# Patient Record
Sex: Male | Born: 1990 | Race: Black or African American | Hispanic: No | Marital: Single | Smoking: Former smoker
Health system: Southern US, Community
[De-identification: ages and names within clinical notes are randomized; demographics above are authoritative.]

---

## 2012-05-29 ENCOUNTER — Emergency Department (INDEPENDENT_AMBULATORY_CARE_PROVIDER_SITE_OTHER)
Admission: EM | Admit: 2012-05-29 | Discharge: 2012-05-29 | Disposition: A | Discharge status: Home / Self Care | Payer: PRIVATE HEALTH INSURANCE | Source: Home / Self Care | Attending: Emergency Medicine | Admitting: Emergency Medicine

## 2012-05-29 ENCOUNTER — Encounter (HOSPITAL_COMMUNITY): Payer: Self-pay

## 2012-05-29 DIAGNOSIS — R05 Cough: Secondary | ICD-10-CM

## 2012-05-29 DIAGNOSIS — J069 Acute upper respiratory infection, unspecified: Secondary | ICD-10-CM

## 2012-05-29 MED ORDER — CETIRIZINE-PSEUDOEPHEDRINE ER 5-120 MG PO TB12: 1.0000 | ORAL_TABLET | Freq: Two times a day (BID) | ORAL | Status: AC

## 2012-05-29 MED ORDER — ALBUTEROL SULFATE HFA 108 (90 BASE) MCG/ACT IN AERS: 2.0000 | INHALATION_SPRAY | RESPIRATORY_TRACT | Status: DC | PRN

## 2012-05-29 MED ORDER — SALINE NASAL SPRAY 0.65 % NA SOLN: 1.0000 | NASAL | Status: DC | PRN

## 2012-05-29 NOTE — ED Notes (Signed)
C/o productive cough of green and brown sputum and intermittent sore throat for 1 month.

## 2012-05-29 NOTE — ED Provider Notes (Signed)
History     CSN: [id number]  Arrival date & time 05/29/12  1240   First MD Initiated Contact with Patient 05/29/12 1302      Chief Complaint  Patient presents with  . Sore Throat  . Cough    (Consider location/radiation/quality/duration/timing/severity/associated sxs/prior treatment) The history is provided by the patient.  Andrew Cline is a 21 y.o. male who complains of onset of cold symptoms for one month.  + sore throat + cough, expectorating greenish brown sputum No pleuritic pain No wheezing + nasal congestion + post-nasal drainage No sinus pain/pressure No voice changes + chest congestion No itchy/red eyes No earache No hemoptysis No SOB + chills/sweats No fever No nausea No vomiting No abdominal pain No diarrhea No skin rashes No fatigue No myalgias No headache  No ill contacts  History reviewed. No pertinent past medical history.  History reviewed. No pertinent past surgical history.  No family history on file.  History  Substance Use Topics  . Smoking status: Never Smoker   . Smokeless tobacco: Not on file  . Alcohol Use: No      Review of Systems  All other systems reviewed and are negative.    Allergies  Review of patient's allergies indicates no known allergies.  Home Medications   Current Outpatient Rx  Name Route Sig Dispense Refill  . ALBUTEROL SULFATE HFA 108 (90 BASE) MCG/ACT IN AERS Inhalation Inhale 2 puffs into the lungs every 4 (four) hours as needed for wheezing. 1 Inhaler 1  . CETIRIZINE-PSEUDOEPHEDRINE ER 5-120 MG PO TB12 Oral Take 1 tablet by mouth 2 (two) times daily. 30 tablet 2  . SALINE NASAL SPRAY 0.65 % NA SOLN Nasal Place 1 spray into the nose as needed for congestion. 30 mL 12    BP 122/83  Pulse 74  Temp 98.5 F (36.9 C) (Oral)  Resp 18  SpO2 96%  Physical Exam  Nursing note and vitals reviewed. Constitutional: He is oriented to person, place, and time. Vital signs are normal. He appears well-developed and  well-nourished. He is active and cooperative.  HENT:  Head: Normocephalic.  Right Ear: Hearing, tympanic membrane, external ear and ear canal normal.  Left Ear: Hearing, tympanic membrane, external ear and ear canal normal.  Nose: Nose normal. Right sinus exhibits no maxillary sinus tenderness and no frontal sinus tenderness. Left sinus exhibits no maxillary sinus tenderness and no frontal sinus tenderness.  Mouth/Throat: Uvula is midline and mucous membranes are normal. Posterior oropharyngeal erythema present. No oropharyngeal exudate, posterior oropharyngeal edema or tonsillar abscesses.       Post nasal drip noted in posterior pharynx  Eyes: Conjunctivae are normal. Pupils are equal, round, and reactive to light. No scleral icterus.  Neck: Trachea normal and normal range of motion. Neck supple. No muscular tenderness present.  Cardiovascular: Normal rate, regular rhythm and normal heart sounds.   Pulmonary/Chest: Effort normal and breath sounds normal.  Neurological: He is alert and oriented to person, place, and time. No cranial nerve deficit or sensory deficit.  Skin: Skin is warm and dry.  Psychiatric: He has a normal mood and affect. His speech is normal and behavior is normal. Judgment and thought content normal. Cognition and memory are normal.    ED Course  Procedures (including critical care time)  Labs Reviewed - No data to display No results found.   1. URI (upper respiratory infection)   2. Cough       MDM  Increase fluid intake, rest.  No antibiotic  indicated.  Begin expectorant/decongestant, topical decongestant, saline nasal spray and/or saline irrigation, and cough suppressant at bedtime. Antihistamines of your choice (Claritin or Zyrtec).  Tylenol or Motrin for fever/discomfort.  Followup with PCP if not improving 7 to 10 days.         Johnsie Kindred, NP 05/29/12 1410

## 2012-05-30 NOTE — ED Provider Notes (Signed)
Medical screening examination/treatment/procedure(s) were performed by non-physician practitioner and as supervising physician I was immediately available for consultation/collaboration.  Raynald Blend, MD 05/30/12 [phone]

## 2013-11-03 ENCOUNTER — Emergency Department (HOSPITAL_COMMUNITY)
Admission: EM | Admit: 2013-11-03 | Discharge: 2013-11-03 | Disposition: A | Discharge status: Home / Self Care | Payer: BC Managed Care – PPO | Source: Home / Self Care | Attending: Emergency Medicine | Admitting: Emergency Medicine

## 2013-11-03 ENCOUNTER — Encounter (HOSPITAL_COMMUNITY): Payer: Self-pay | Admitting: Emergency Medicine

## 2013-11-03 DIAGNOSIS — B029 Zoster without complications: Secondary | ICD-10-CM

## 2013-11-03 MED ORDER — VALACYCLOVIR HCL 1 G PO TABS: 1000.0000 mg | ORAL_TABLET | Freq: Three times a day (TID) | ORAL | Status: AC

## 2013-11-03 MED ORDER — PREDNISONE 10 MG PO KIT: PACK | ORAL | Status: DC

## 2013-11-03 NOTE — ED Provider Notes (Signed)
CSN: [id number]     Arrival date & time 11/03/13  1641 History   First MD Initiated Contact with Patient 11/03/13 1704     Chief Complaint  Patient presents with  . Rash   (Consider location/radiation/quality/duration/timing/severity/associated sxs/prior Treatment) HPI Comments: 23 year old male presents complaining of rash. For 3 days, he has had an itching rash in the center of his back. He has tried putting antibiotic cream on it which has not helped. He is never had a rash like this before. He denies any symptoms elsewhere and has no systemic symptoms. he does have a history of chickenpox when he was little.  Patient is a 23 y.o. male presenting with rash.  Rash Associated symptoms: no abdominal pain, no diarrhea, no fatigue, no fever, no joint pain, no myalgias, no nausea, no shortness of breath, no sore throat and not vomiting     History reviewed. No pertinent past medical history. History reviewed. No pertinent past surgical history. History reviewed. No pertinent family history. History  Substance Use Topics  . Smoking status: Never Smoker   . Smokeless tobacco: Not on file  . Alcohol Use: No    Review of Systems  Constitutional: Negative for fever, chills and fatigue.  HENT: Negative for sore throat.   Eyes: Negative for visual disturbance.  Respiratory: Negative for cough and shortness of breath.   Cardiovascular: Negative for chest pain, palpitations and leg swelling.  Gastrointestinal: Negative for nausea, vomiting, abdominal pain, diarrhea and constipation.  Genitourinary: Negative for dysuria, urgency, frequency and hematuria.  Musculoskeletal: Negative for arthralgias, myalgias, neck pain and neck stiffness.  Skin: Positive for rash.  Neurological: Negative for dizziness, weakness and light-headedness.    Allergies  Review of patient's allergies indicates no known allergies.  Home Medications   Current Outpatient Rx  Name  Route  Sig  Dispense  Refill  .  EXPIRED: albuterol (PROVENTIL HFA;VENTOLIN HFA) 108 (90 BASE) MCG/ACT inhaler   Inhalation   Inhale 2 puffs into the lungs every 4 (four) hours as needed for wheezing.   1 Inhaler   1   . PredniSONE 10 MG KIT      12 day taper dose pack. Use as directed   48 each   0   . EXPIRED: sodium chloride (OCEAN NASAL SPRAY) 0.65 % nasal spray   Nasal   Place 1 spray into the nose as needed for congestion.   30 mL   12   . valACYclovir (VALTREX) 1000 MG tablet   Oral   Take 1 tablet (1,000 mg total) by mouth 3 (three) times daily.   21 tablet   0    BP 115/74  Pulse 75  Temp(Src) 98.6 F (37 C) (Oral)  Resp 14  SpO2 100% Physical Exam  Nursing note and vitals reviewed. Constitutional: He is oriented to person, place, and time. He appears well-developed and well-nourished. No distress.  HENT:  Head: Normocephalic.  Pulmonary/Chest: Effort normal. No respiratory distress.  Neurological: He is alert and oriented to person, place, and time. Coordination normal.  Skin: Skin is warm and dry. Rash noted. Rash is vesicular (erythematous, grouped nontender vesicular in the lower thoracic back from the midline extending to the right, approximately an 8 cm diameter confluence). He is not diaphoretic.  Psychiatric: He has a normal mood and affect. Judgment normal.    ED Course  Procedures (including critical care time) Labs Review Labs Reviewed - No data to display Imaging Review No results found.    MDM  1. Herpes zoster    Treated with Valtrex and symptomatic management. Recommended over-the-counter antihistamines as needed for itching. Followup when necessary if worsening or if this does not follow the expected course of shingles, reviewed in detail with the patient.  Meds ordered this encounter  Medications  . valACYclovir (VALTREX) 1000 MG tablet    Sig: Take 1 tablet (1,000 mg total) by mouth 3 (three) times daily.    Dispense:  21 tablet    Refill:  0  . PredniSONE 10  MG KIT    Sig: 12 day taper dose pack. Use as directed    Dispense:  48 each    Refill:  Pomeroy Airrion Otting, PA-C 11/03/13 1751

## 2013-11-03 NOTE — Discharge Instructions (Signed)
Shingles Shingles (herpes zoster) is an infection that is caused by the same virus that causes chickenpox (varicella). The infection causes a painful skin rash and fluid-filled blisters, which eventually break open, crust over, and heal. It may occur in any area of the body, but it usually affects only one side of the body or face. The pain of shingles usually lasts about 1 month. However, some people with shingles may develop long-term (chronic) pain in the affected area of the body. Shingles often occurs many years after the person had chickenpox. It is more common:  In people older than 50 years.  In people with weakened immune systems, such as those with HIV, AIDS, or cancer.  In people taking medicines that weaken the immune system, such as transplant medicines.  In people under great stress. CAUSES  Shingles is caused by the varicella zoster virus (VZV), which also causes chickenpox. After a person is infected with the virus, it can remain in the person's body for years in an inactive state (dormant). To cause shingles, the virus reactivates and breaks out as an infection in a nerve root. The virus can be spread from person to person (contagious) through contact with open blisters of the shingles rash. It will only spread to people who have not had chickenpox. When these people are exposed to the virus, they may develop chickenpox. They will not develop shingles. Once the blisters scab over, the person is no longer contagious and cannot spread the virus to others. SYMPTOMS  Shingles shows up in stages. The initial symptoms may be pain, itching, and tingling in an area of the skin. This pain is usually described as burning, stabbing, or throbbing.In a few days or weeks, a painful red rash will appear in the area where the pain, itching, and tingling were felt. The rash is usually on one side of the body in a band or belt-like pattern. Then, the rash usually turns into fluid-filled blisters. They  will scab over and dry up in approximately 2 3 weeks. Flu-like symptoms may also occur with the initial symptoms, the rash, or the blisters. These may include:  Fever.  Chills.  Headache.  Upset stomach. DIAGNOSIS  Your caregiver will perform a skin exam to diagnose shingles. Skin scrapings or fluid samples may also be taken from the blisters. This sample will be examined under a microscope or sent to a lab for further testing. TREATMENT  There is no specific cure for shingles. Your caregiver will likely prescribe medicines to help you manage the pain, recover faster, and avoid long-term problems. This may include antiviral drugs, anti-inflammatory drugs, and pain medicines. HOME CARE INSTRUCTIONS   Take a cool bath or apply cool compresses to the area of the rash or blisters as directed. This may help with the pain and itching.   Only take over-the-counter or prescription medicines as directed by your caregiver.   Rest as directed by your caregiver.  Keep your rash and blisters clean with mild soap and cool water or as directed by your caregiver.  Do not pick your blisters or scratch your rash. Apply an anti-itch cream or numbing creams to the affected area as directed by your caregiver.  Keep your shingles rash covered with a loose bandage (dressing).  Avoid skin contact with:  Babies.   Pregnant women.   Children with eczema.   Elderly people with transplants.   People with chronic illnesses, such as leukemia or AIDS.   Wear loose-fitting clothing to help ease   the pain of material rubbing against the rash.  Keep all follow-up appointments with your caregiver.If the area involved is on your face, you may receive a referral for follow-up to a specialist, such as an eye doctor (ophthalmologist) or an ear, nose, and throat (ENT) doctor. Keeping all follow-up appointments will help you avoid eye complications, chronic pain, or disability.  SEEK IMMEDIATE MEDICAL  CARE IF:   You have facial pain, pain around the eye area, or loss of feeling on one side of your face.  You have ear pain or ringing in your ear.  You have loss of taste.  Your pain is not relieved with prescribed medicines.   Your redness or swelling spreads.   You have more pain and swelling.  Your condition is worsening or has changed.   You have a feveror persistent symptoms for more than 2 3 days.  You have a fever and your symptoms suddenly get worse. MAKE SURE YOU:  Understand these instructions.  Will watch your condition.  Will get help right away if you are not doing well or get worse. Document Released: 09/17/2005 Document Revised: 06/11/2012 Document Reviewed: 05/01/2012 [employer] Patient Information 2014 [employer].  

## 2013-11-03 NOTE — ED Provider Notes (Signed)
Medical screening examination/treatment/procedure(s) were performed by non-physician practitioner and as supervising physician I was immediately available for consultation/collaboration.  Omolola Mittman, M.D.  Janaiyah Blackard C Angelette Ganus, MD 11/03/13 2201 

## 2013-11-03 NOTE — ED Notes (Signed)
C/o blister like rash in center of back x 3 days with irritation.  Pt has tried antibiotic cream with no relief.  Denies any changes in soaps or detergents.

## 2013-11-04 NOTE — ED Notes (Signed)
Chart review.

## 2013-11-04 NOTE — ED Notes (Signed)
Patient called w questions, left message on answering machine. after discussion w Dr Lorenz CoasterKeller, pt has been advised to try to stop touching his eyes, use cool compresses, use systaine OTC eye drops, and to be rechecked if has continued problem

## 2014-01-08 ENCOUNTER — Encounter (HOSPITAL_COMMUNITY): Payer: Self-pay | Admitting: Emergency Medicine

## 2014-01-08 ENCOUNTER — Emergency Department (HOSPITAL_COMMUNITY): Payer: BC Managed Care – PPO

## 2014-01-08 ENCOUNTER — Emergency Department (HOSPITAL_COMMUNITY)
Admission: EM | Admit: 2014-01-08 | Discharge: 2014-01-08 | Disposition: A | Discharge status: Home / Self Care | Payer: BC Managed Care – PPO | Attending: Emergency Medicine | Admitting: Emergency Medicine

## 2014-01-08 DIAGNOSIS — IMO0002 Reserved for concepts with insufficient information to code with codable children: Secondary | ICD-10-CM | POA: Insufficient documentation

## 2014-01-08 DIAGNOSIS — R0602 Shortness of breath: Secondary | ICD-10-CM | POA: Insufficient documentation

## 2014-01-08 DIAGNOSIS — R042 Hemoptysis: Secondary | ICD-10-CM | POA: Insufficient documentation

## 2014-01-08 DIAGNOSIS — R059 Cough, unspecified: Secondary | ICD-10-CM | POA: Insufficient documentation

## 2014-01-08 DIAGNOSIS — R05 Cough: Secondary | ICD-10-CM | POA: Insufficient documentation

## 2014-01-08 DIAGNOSIS — R079 Chest pain, unspecified: Secondary | ICD-10-CM | POA: Insufficient documentation

## 2014-01-08 LAB — I-STAT CHEM 8, ED
BUN: 11 mg/dL (ref 6–23)
CALCIUM ION: 1.27 mmol/L — AB (ref 1.12–1.23)
CREATININE: 1.1 mg/dL (ref 0.50–1.35)
Chloride: 102 mEq/L (ref 96–112)
GLUCOSE: 121 mg/dL — AB (ref 70–99)
HEMATOCRIT: 47 % (ref 39.0–52.0)
HEMOGLOBIN: 16 g/dL (ref 13.0–17.0)
POTASSIUM: 4.3 meq/L (ref 3.7–5.3)
Sodium: 143 mEq/L (ref 137–147)
TCO2: 29 mmol/L (ref 0–100)

## 2014-01-08 LAB — D-DIMER, QUANTITATIVE: D-Dimer, Quant: <0.27 ug/mL-FEU (ref 0.00–0.48)

## 2014-01-08 NOTE — ED Notes (Signed)
Pt states that he stopped smoking 2 weeks ago and had intermit chest pain to rt and lt chest and rlq abd. States that he felt as if he had some burning feeling. Intermit cough, no sob, no hx.

## 2014-01-08 NOTE — Discharge Instructions (Signed)

## 2014-01-08 NOTE — ED Provider Notes (Signed)
CSN: [id number]     Arrival date & time 01/08/14  1642 History   First MD Initiated Contact with Patient 01/08/14 2000     Chief Complaint  Patient presents with  . Pleurisy     Patient is a 23 y.o. male presenting with chest pain. The history is provided by the patient.  Chest Pain Pain location:  R chest (posterior chest) Pain quality: sharp   Pain severity:  Moderate Onset quality:  Gradual Duration:  2 weeks Timing:  Intermittent Progression:  Worsening Chronicity:  New Relieved by:  Rest Worsened by:  Deep breathing Associated symptoms: cough and shortness of breath   Associated symptoms: no abdominal pain, no fever and not vomiting    Pt reports CP on/off for 2 weeks Mostly worse with deep breathing He reports one episode of hemoptysis He reports SOB  He denies known h/o cardiac disease No h/o DVT/PE     PMH - none  History  Substance Use Topics  . Smoking status: Never Smoker   . Smokeless tobacco: Not on file  . Alcohol Use: No    Review of Systems  Constitutional: Negative for fever.  Respiratory: Positive for cough and shortness of breath.   Cardiovascular: Positive for chest pain.  Gastrointestinal: Negative for vomiting and abdominal pain.  All other systems reviewed and are negative.     Allergies  Review of patient's allergies indicates no known allergies.  Home Medications   Current Outpatient Rx  Name  Route  Sig  Dispense  Refill  . EXPIRED: albuterol (PROVENTIL HFA;VENTOLIN HFA) 108 (90 BASE) MCG/ACT inhaler   Inhalation   Inhale 2 puffs into the lungs every 4 (four) hours as needed for wheezing.   1 Inhaler   1   . PredniSONE 10 MG KIT      12 day taper dose pack. Use as directed   48 each   0   . EXPIRED: sodium chloride (OCEAN NASAL SPRAY) 0.65 % nasal spray   Nasal   Place 1 spray into the nose as needed for congestion.   30 mL   12    BP 131/80  Pulse 80  Temp(Src) 99.8 F (37.7 C) (Oral)  Resp 20  SpO2  98% Physical Exam CONSTITUTIONAL: Well developed/well nourished HEAD: Normocephalic/atraumatic EYES: EOMI/PERRL ENMT: Mucous membranes moist NECK: supple no meningeal signs SPINE:entire spine nontender CV: S1/S2 noted, no murmurs/rubs/gallops noted LUNGS: Lungs are clear to auscultation bilaterally, no apparent distress Chest - nontender to palpation ABDOMEN: soft, nontender, no rebound or guarding GU:no cva tenderness NEURO: Pt is awake/alert, moves all extremitiesx4 EXTREMITIES: pulses normal, full ROM, no LE pain/edema SKIN: warm, color normal PSYCH: no abnormalities of mood noted  ED Course  Procedures  9:17 PM D-dimer ordered to r/o PE as unable to utilize [facility] score as he had hemoptysis He is well appearing at this time 10:08 PM Pt well appearing, no distress, watching TV D-dimer negative I doubt ACS/dissection at this time Stable for d/c home Labs Review Labs Reviewed  I-STAT CHEM 8, ED - Abnormal; Notable for the following:    Glucose, Bld 121 (*)    Calcium, Ion 1.27 (*)    All other components within normal limits  D-DIMER, QUANTITATIVE   Imaging Review Dg Chest 2 View  01/08/2014   CLINICAL DATA:  Pleurisy. Shortness of breath with chest pain, cough and congestion for 2 weeks.  EXAM: CHEST  2 VIEW  COMPARISON:  None.  FINDINGS: The heart size and mediastinal  contours are normal. The lungs are clear. There is no pleural effusion or pneumothorax. No acute osseous findings are identified.  IMPRESSION: No active cardiopulmonary process.   Electronically Signed   By: Camie Patience M.D.   On: 01/08/2014 18:55     Date: 01/08/2014  Rate: 82  Rhythm: normal sinus rhythm  QRS Axis: normal  Intervals: normal  ST/T Wave abnormalities: normal  Conduction Disutrbances:none    MDM   Final diagnoses:  Chest pain    Nursing notes including past medical history and social history reviewed and considered in documentation xrays reviewed and considered Labs/vital  reviewed and considered     Sharyon Cable, MD 01/08/14 2209

## 2014-07-29 ENCOUNTER — Ambulatory Visit (INDEPENDENT_AMBULATORY_CARE_PROVIDER_SITE_OTHER): Payer: Managed Care, Other (non HMO) | Admitting: Internal Medicine

## 2014-07-29 ENCOUNTER — Encounter: Payer: Self-pay | Admitting: Internal Medicine

## 2014-07-29 VITALS — BP 128/82 | HR 77 | Ht 72.0 in | Wt 216.0 lb

## 2014-07-29 DIAGNOSIS — R0789 Other chest pain: Secondary | ICD-10-CM

## 2014-07-29 DIAGNOSIS — R0600 Dyspnea, unspecified: Secondary | ICD-10-CM

## 2014-07-29 DIAGNOSIS — R079 Chest pain, unspecified: Secondary | ICD-10-CM

## 2014-07-29 NOTE — Progress Notes (Signed)
Subjective:    Patient ID: Andrew Cline, male    DOB: 1990-11-04  MRN: [id number]  HPI  23 yobm [employer] senior healthy as child but notified heart m did not restrict but had trouble with aerobics tended to be middle of end of group at any exercise/ started smoking age 23 and about the same until around 2014 with onset of fatigue and sensation "felt dry" with wt gain x 40 lb wt gain with assoc doe and sensation can't get deep breath in with w/u in ER 01/08/14 with panic disorder so self referred to pulmonary clinic 07/29/2014    07/29/2014 1st [facility] office visit/ Andrew Cline   Chief Complaint  Patient presents with  . Pulmonary Consult    Self referral. Pt c/o "lung pain"- pain in rib area x 4 months- comes and goes and worsens with exertion esp on the left side. Pt also c/o cough for the past year with occ blood streaked sputum.   since er eval develped stiff L shoulder and midline cp  Present sitting and resolves when lie down, better when eat healthy, feels tight with deep breath.  Midline pain, somewhat migratory but resolves in supine.  Still able to play basketball despite wt gain.  No obvious other patterns in day to day or daytime variabilty or assoc excess or purulent mucus   or chest tightness, subjective wheeze overt sinus or hb symptoms. No unusual exp hx or h/o childhood pna/ asthma or knowledge of premature birth.  Sleeping ok without nocturnal  or early am exacerbation  of respiratory  c/o's or need for noct saba. Also denies any obvious fluctuation of symptoms with weather or environmental changes or other aggravating or alleviating factors except as outlined above   Current Medications, Allergies, Complete Past Medical History, Past Surgical History, Family History, and Social History were reviewed in [facility] electronic medical record.             Review of Systems  Constitutional: Negative for fever, chills, activity change, appetite change and  unexpected weight change.  HENT: Negative for congestion, dental problem, postnasal drip, rhinorrhea, sneezing, sore throat, trouble swallowing and voice change.   Eyes: Negative for visual disturbance.  Respiratory: Positive for cough. Negative for choking and shortness of breath.   Cardiovascular: Positive for chest pain. Negative for leg swelling.  Gastrointestinal: Negative for nausea, vomiting and abdominal pain.  Genitourinary: Negative for difficulty urinating.  Musculoskeletal: Positive for arthralgias.  Skin: Negative for rash.  Psychiatric/Behavioral: Negative for behavioral problems and confusion.       Objective:   Physical Exam   amb bm nad  Wt Readings from Last 3 Encounters:  07/29/14 216 lb (97.977 kg)      HEENT: nl dentition, turbinates, and orophanx. Nl external ear canals without cough reflex   NECK :  without JVD/Nodes/TM/ nl carotid upstrokes bilaterally   LUNGS: no acc muscle use, clear to A and P bilaterally without cough on insp or exp maneuvers   CV:  RRR  no s3 or murmur or increase in P2, no edema   ABD:  soft and nontender with nl excursion in the supine position. No bruits or organomegaly, bowel sounds nl  MS:  warm without deformities, calf tenderness, cyanosis or clubbing  SKIN: warm and dry without lesions    NEURO:  alert, approp, no deficits     01/08/14 CXR wnl     Chemistry      Component Value Date/Time  NA 143 01/08/2014 2044   K 4.3 01/08/2014 2044   CL 102 01/08/2014 2044   BUN 11 01/08/2014 2044   CREATININE 1.10 01/08/2014 2044   No results found for this basename: CALCIUM, ALKPHOS, AST, ALT, BILITOT      Lab Results  Component Value Date   HGB 16.0 01/08/2014   HCT 47.0 01/08/2014       Assessment & Plan:

## 2014-07-29 NOTE — Patient Instructions (Signed)
Try prilosec 20mg   Take 30-60 min before first meal of the day and Pepcid 20 mg one bedtime x 2 weeks  Classic  Migratory chest/ pain  with a very limited distribution of pain locations, daytime,  worse in sitting position, associated with generalized abd bloating, not present supine due to the dome effect of the diaphragm is  canceled in that position. Frequently these patients have had multiple negative GI workups and CT scans.  Treatment consists of avoiding foods that cause gas (especially beans and raw vegetables like spinach and salads and boiled eggs)  and citrucel 1 heaping tsp twice daily with a large glass of water.  Pain should improve w/in 2 weeks and if not then consider further work up  In meantime keep exercising at least 30 min daily    Call me in two weeks if not 100% satisfied

## 2014-07-30 DIAGNOSIS — R0600 Dyspnea, unspecified: Secondary | ICD-10-CM | POA: Insufficient documentation

## 2014-07-30 DIAGNOSIS — R0789 Other chest pain: Principal | ICD-10-CM

## 2014-07-30 DIAGNOSIS — R079 Chest pain, unspecified: Secondary | ICD-10-CM | POA: Insufficient documentation

## 2014-07-30 NOTE — Assessment & Plan Note (Signed)
longterm and probably related to conditioning > will need full w/u including cpst with spirometry before and after if not improving with regular paced exercise  See instructions for specific recommendations which were reviewed directly with the patient who was given a copy with highlighter outlining the key components.

## 2014-07-30 NOTE — Assessment & Plan Note (Signed)
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 Frequently these patients have had multiple negative GI workups and CT scans.  Treatment consists of avoiding foods that cause gas (especially beans and raw vegetables like spinach and salads)  and citrucel 1 heaping tsp twice daily with a large glass of water.  Pain should improve w/in 2 weeks and if not then consider further GI work up.    Will also add gerd rx and due to age and healthy appearance did not rec further eval until he tries this rx first.

## 2015-03-20 IMAGING — CR DG CHEST 2V
2 series · 2 of 2 positions shown · non-contrast
Comparison: None.

CLINICAL DATA: Pleurisy. Shortness of breath with chest pain, cough
and congestion for 2 weeks.

EXAM:
CHEST  2 VIEW

[w chest pa]
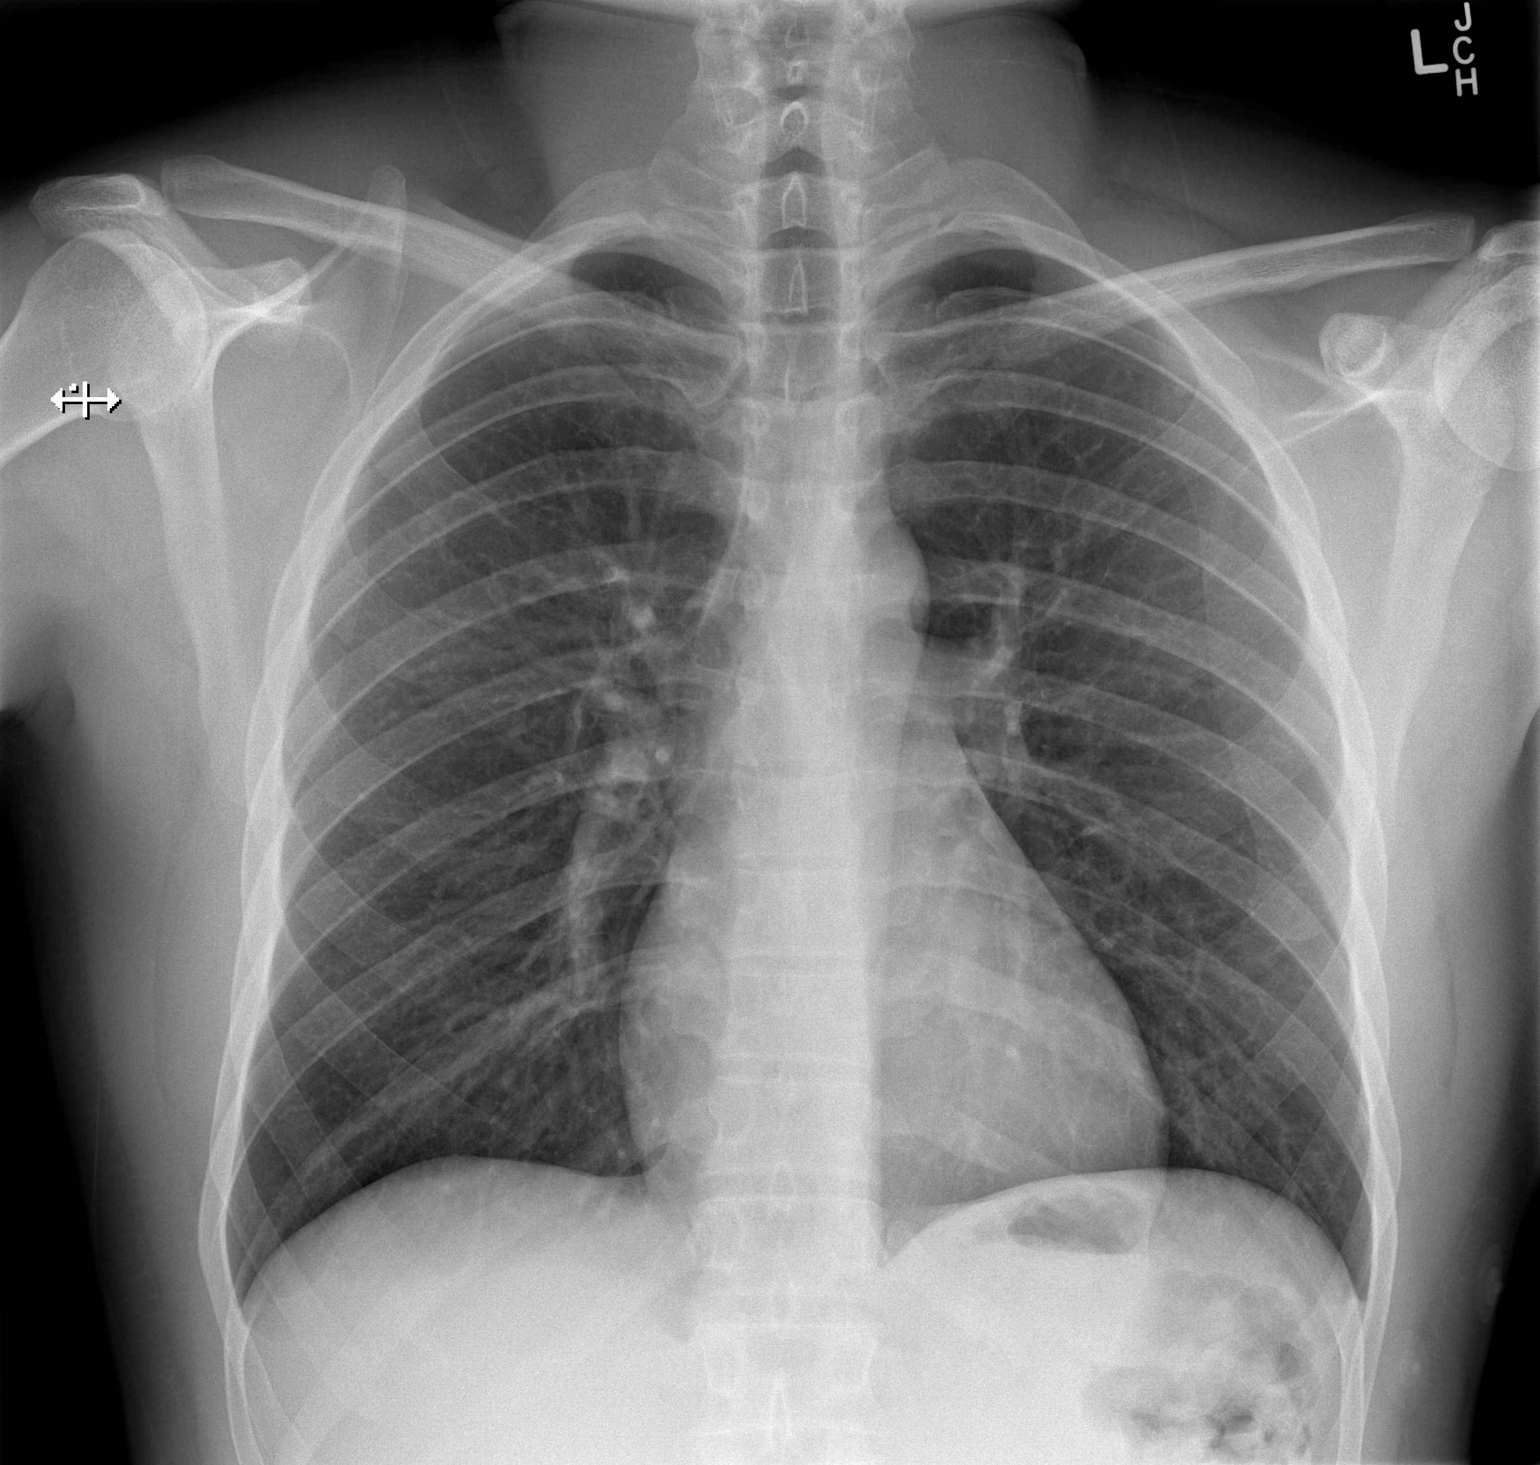

[w chest lat]
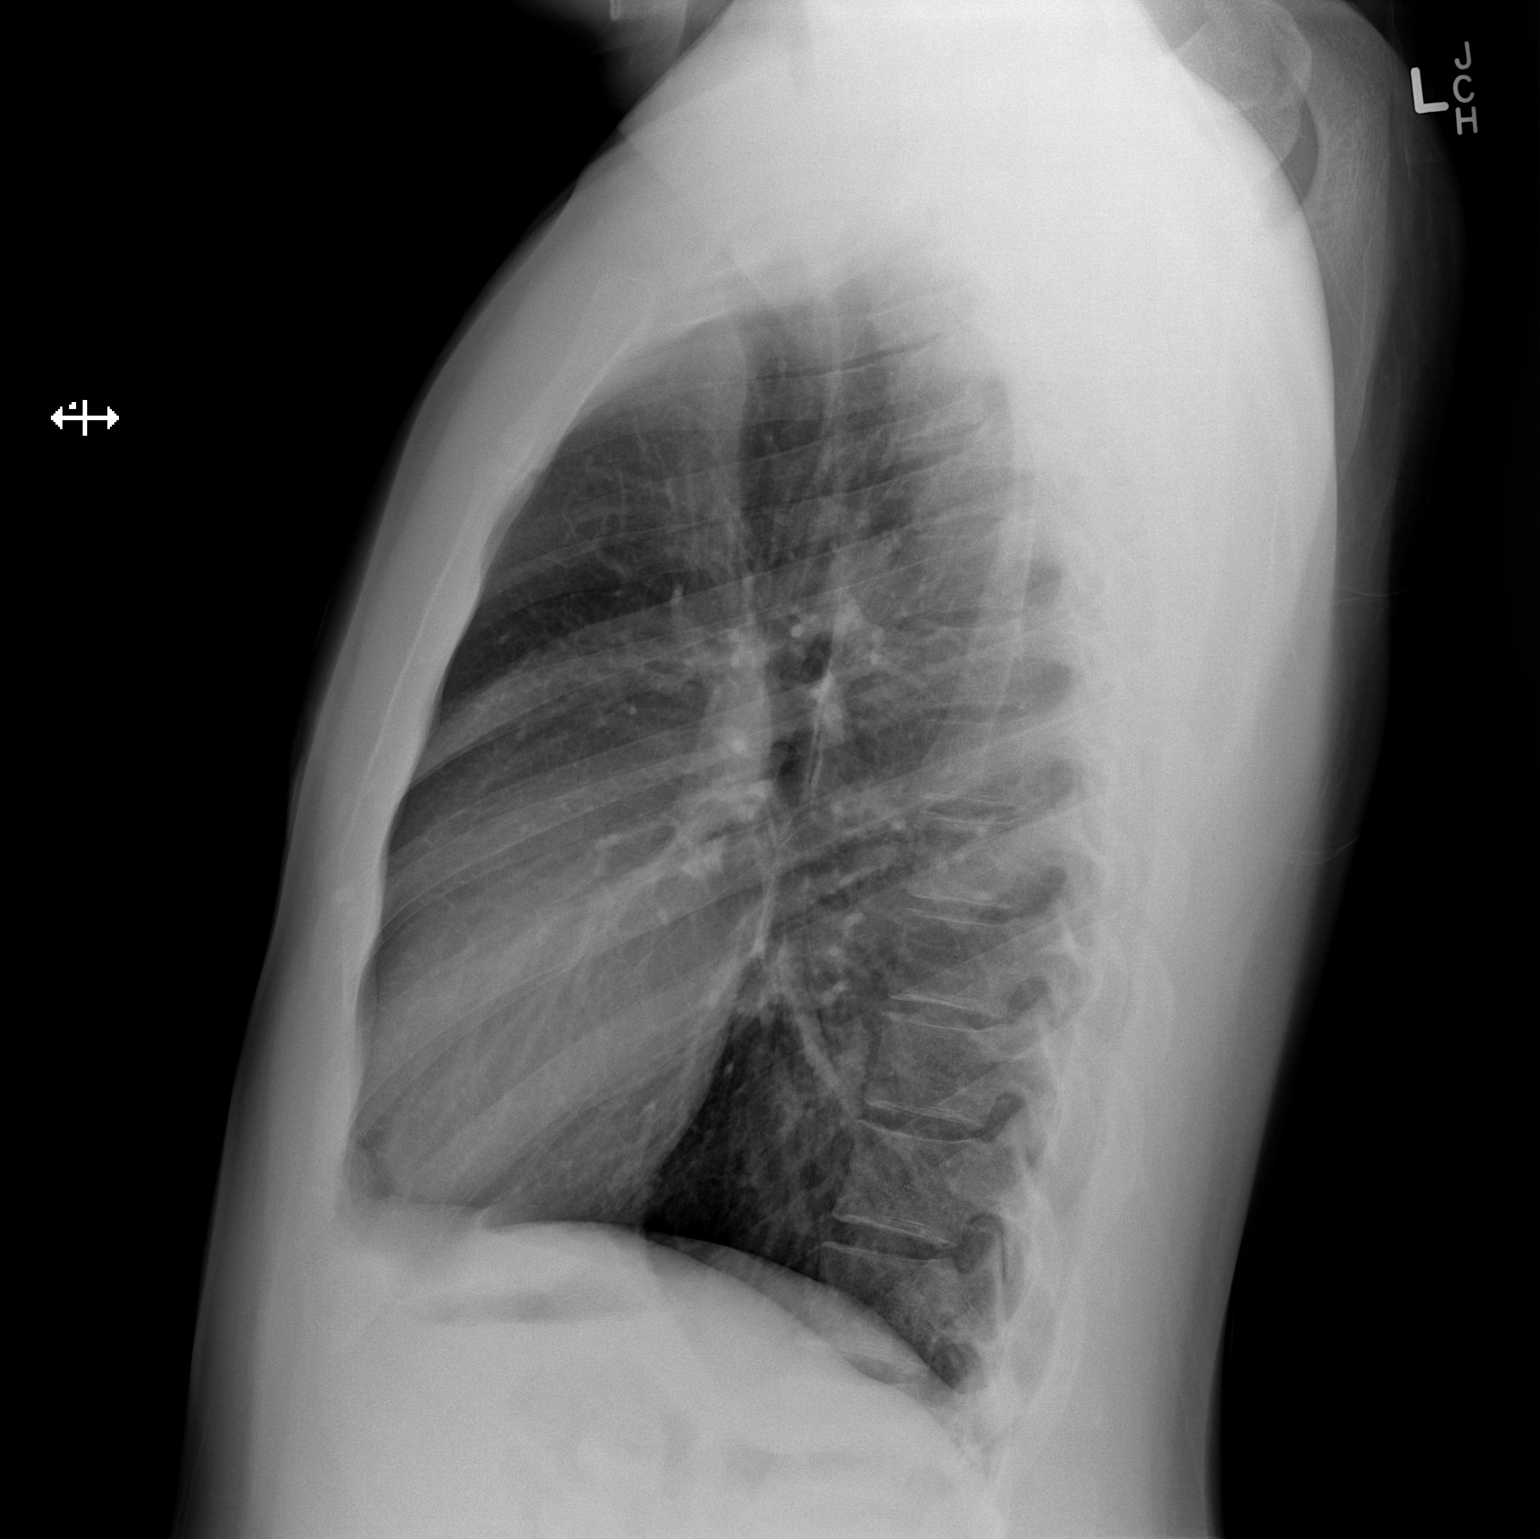

[2 of 2 positions shown; findings below may reference images not displayed]

FINDINGS: The heart size and mediastinal contours are normal. The lungs are
clear. There is no pleural effusion or pneumothorax. No acute
osseous findings are identified.
IMPRESSION: No active cardiopulmonary process.
